# Patient Record
Sex: Female | Born: 1995 | Race: Black or African American | Hispanic: No | Marital: Single | Smoking: Never smoker
Health system: Southern US, Community
[De-identification: ages and names within clinical notes are randomized; demographics above are authoritative.]

---

## 2020-09-10 ENCOUNTER — Other Ambulatory Visit: Payer: Self-pay

## 2020-09-10 ENCOUNTER — Inpatient Hospital Stay (HOSPITAL_COMMUNITY): Payer: Medicaid Other

## 2020-09-10 ENCOUNTER — Inpatient Hospital Stay (HOSPITAL_COMMUNITY)
Admission: AD | Admit: 2020-09-10 | Discharge: 2020-09-10 | Disposition: A | Discharge status: Home / Self Care | Payer: Medicaid Other | Attending: Obstetrics & Gynecology | Admitting: Obstetrics & Gynecology

## 2020-09-10 ENCOUNTER — Encounter (HOSPITAL_COMMUNITY): Payer: Self-pay | Admitting: Obstetrics & Gynecology

## 2020-09-10 DIAGNOSIS — R109 Unspecified abdominal pain: Secondary | ICD-10-CM

## 2020-09-10 DIAGNOSIS — N949 Unspecified condition associated with female genital organs and menstrual cycle: Secondary | ICD-10-CM | POA: Diagnosis not present

## 2020-09-10 DIAGNOSIS — Z3A19 19 weeks gestation of pregnancy: Secondary | ICD-10-CM | POA: Insufficient documentation

## 2020-09-10 DIAGNOSIS — O26892 Other specified pregnancy related conditions, second trimester: Secondary | ICD-10-CM | POA: Diagnosis not present

## 2020-09-10 DIAGNOSIS — O99891 Other specified diseases and conditions complicating pregnancy: Secondary | ICD-10-CM | POA: Diagnosis not present

## 2020-09-10 DIAGNOSIS — R102 Pelvic and perineal pain: Secondary | ICD-10-CM | POA: Diagnosis not present

## 2020-09-10 LAB — CBC
HCT: 35.3 % — ABNORMAL LOW (ref 36.0–46.0)
Hemoglobin: 12.2 g/dL (ref 12.0–15.0)
MCH: 34.2 pg — ABNORMAL HIGH (ref 26.0–34.0)
MCHC: 34.6 g/dL (ref 30.0–36.0)
MCV: 98.9 fL (ref 80.0–100.0)
Platelets: 363 10*3/uL (ref 150–400)
RBC: 3.57 MIL/uL — ABNORMAL LOW (ref 3.87–5.11)
RDW: 12 % (ref 11.5–15.5)
WBC: 7.4 10*3/uL (ref 4.0–10.5)
nRBC: 0 % (ref 0.0–0.2)

## 2020-09-10 LAB — URINALYSIS, ROUTINE W REFLEX MICROSCOPIC
Bilirubin Urine: NEGATIVE
Glucose, UA: NEGATIVE mg/dL
Hgb urine dipstick: NEGATIVE
Ketones, ur: 5 mg/dL — AB
Leukocytes,Ua: NEGATIVE
Nitrite: NEGATIVE
Protein, ur: 30 mg/dL — AB
Specific Gravity, Urine: 1.028 (ref 1.005–1.030)
pH: 5 (ref 5.0–8.0)

## 2020-09-10 LAB — ABO/RH: ABO/RH(D): A POS

## 2020-09-10 LAB — HCG, QUANTITATIVE, PREGNANCY: hCG, Beta Chain, Quant, S: 9563 m[IU]/mL — ABNORMAL HIGH (ref <5)

## 2020-09-10 LAB — POCT PREGNANCY, URINE: Preg Test, Ur: POSITIVE — AB

## 2020-09-10 NOTE — MAU Provider Note (Signed)
Chief Complaint:  Abdominal Pain   None    HPI: Chelsea Ford is a 25 y.o. G1P0 at 339w3d who presents to maternity admissions reporting missed cycles since December (LMP=04/27/20), left lower pelvic pain that happens randomly and feels sharp, none now. Unsure of dates because cycles were irregular since start of OCPs. Had a +UPT at home a couple of weeks ago and at [facility]. This is not a desired pregnancy, unsure of options. Denies vaginal bleeding, leaking of fluid, fever, falls, or recent illness.   Pregnancy Course: No PNC  No past medical history on file. OB History  Gravida Para Term Preterm AB Living  1            SAB IAB Ectopic Multiple Live Births               # Outcome Date GA Lbr Len/2nd Weight Sex Delivery Anes PTL Lv  1 Current           Not on File No medications prior to admission.   I have reviewed patient's Past Medical Hx, Surgical Hx, Family Hx, Social Hx, medications and allergies.   ROS:  Review of Systems  Constitutional: Negative for fatigue and fever.  HENT: Negative for congestion and sore throat.   Gastrointestinal: Positive for nausea (had some several weeks ago, none now). Negative for abdominal pain.  Genitourinary: Positive for pelvic pain (left lower).  Musculoskeletal: Negative for back pain.  Neurological: Negative for dizziness, syncope and headaches.  All other systems reviewed and are negative.  Physical Exam   Patient Vitals for the past 24 hrs:  BP Temp Temp src Pulse Resp SpO2 Height Weight  09/10/20 1329 118/69 99 F (37.2 C) Oral 96 16 100 % -- --  09/10/20 1321 -- -- -- -- -- -- 5' 1.5" (1.562 m) 148 lb (67.1 kg)   Constitutional: Well-developed, well-nourished female in no acute distress.  Cardiovascular: normal rate & rhythm, no murmur Respiratory: normal effort, lung sounds clear throughout GI: Abd soft, non-tender, gravid appropriate for gestational age. Pos BS x 4 MS: Extremities nontender, no  edema, normal ROM Neurologic: Alert and oriented x 4.  GU: no CVA tenderness Pelvic exam deferred  Labs: Results for orders placed or performed during the hospital encounter of 09/10/20 (from the past 24 hour(s))  CBC     Status: Abnormal   Collection Time: 09/10/20 12:49 PM  Result Value Ref Range   WBC 7.4 4.0 - 10.5 K/uL   RBC 3.57 (L) 3.87 - 5.11 MIL/uL   Hemoglobin 12.2 12.0 - 15.0 g/dL   HCT 16.135.3 (L) 09.636.0 - 04.546.0 %   MCV 98.9 80.0 - 100.0 fL   MCH 34.2 (H) 26.0 - 34.0 pg   MCHC 34.6 30.0 - 36.0 g/dL   RDW 40.912.0 81.111.5 - 91.415.5 %   Platelets 363 150 - 400 K/uL   nRBC 0.0 0.0 - 0.2 %  ABO/Rh     Status: None   Collection Time: 09/10/20 12:49 PM  Result Value Ref Range   ABO/RH(D)      A POS Performed at [facility], [address] [place], [place] [address]   hCG, quantitative, pregnancy     Status: Abnormal   Collection Time: 09/10/20 12:49 PM  Result Value Ref Range   hCG, Beta Chain, Quant, S 9,563 (H) <5 mIU/mL  Pregnancy, urine POC     Status: Abnormal   Collection Time: 09/10/20  2:20 PM  Result Value Ref Range   Preg Test, Ur POSITIVE (A) NEGATIVE  Urinalysis, Routine w reflex microscopic Urine, Clean Catch     Status: Abnormal   Collection Time: 09/10/20  2:21 PM  Result Value Ref Range   Color, Urine AMBER (A) YELLOW   APPearance CLOUDY (A) CLEAR   Specific Gravity, Urine 1.028 1.005 - 1.030   pH 5.0 5.0 - 8.0   Glucose, UA NEGATIVE NEGATIVE mg/dL   Hgb urine dipstick NEGATIVE NEGATIVE   Bilirubin Urine NEGATIVE NEGATIVE   Ketones, ur 5 (A) NEGATIVE mg/dL   Protein, ur 30 (A) NEGATIVE mg/dL   Nitrite NEGATIVE NEGATIVE   Leukocytes,Ua NEGATIVE NEGATIVE   RBC / HPF 0-5 0 - 5 RBC/hpf   WBC, UA 0-5 0 - 5 WBC/hpf   Bacteria, UA RARE (A) NONE SEEN   Squamous Epithelial / LPF 21-50 0 - 5   Mucus PRESENT     Imaging:  [place] MFM OB LIMITED  Result Date: 09/10/2020 ----------------------------------------------------------------------  OBSTETRICS REPORT                        (Signed Final 09/10/2020 03:55 pm) ---------------------------------------------------------------------- Patient Info  ID #:       [id number]                          D.O.B.:  Oct 25, 1995 (24 yrs)  Name:       Chelsea Ford             Visit Date: 09/10/2020 02:53 pm ---------------------------------------------------------------------- Performed By  Attending:        Noralee Space MD        Ref. Address:     [facility]                                                             [facility]                                                             Healthcare  Performed By:     Sandi Mealy        Secondary Phy.:   [facility] MAU/Triage                    RDMS  Referred By:      Judye Bos              [facility]uba CNM                               [facility] ---------------------------------------------------------------------- Orders  #  Description                           Code        Ordered By  1  [place]  MFM OB LIMITED                     21308.65    Nasiya Pascual ----------------------------------------------------------------------  #  Order #                     Accession #                Episode #  1  [id number]                   [id number]                 [id number] ---------------------------------------------------------------------- Indications  [redacted] weeks gestation of pregnancy                Z3A.19  Abdominal pain in pregnancy                    O99.89 ---------------------------------------------------------------------- Fetal Evaluation  Num Of Fetuses:         1  Fetal Heart Rate(bpm):  147  Cardiac Activity:       Observed  Presentation:           Breech  Placenta:               Posterior  P. Cord Insertion:      Visualized  Amniotic Fluid  AFI FV:      Within normal limits                              Largest Pocket(cm)                              4.6 ---------------------------------------------------------------------- Biometry  LV:         4.8  mm ---------------------------------------------------------------------- Gestational Age  LMP:           19w 3d        Date:  04/27/20                 EDD:   02/01/21  Best:          19w 3d     Det. By:  LMP  (04/27/20)          EDD:   02/01/21 ---------------------------------------------------------------------- Anatomy  Ventricles:            Appears normal         Stomach:                Appears normal, left                                                                        sided  Thoracic:              Appears normal         Kidneys:                Appear normal  Diaphragm:             Appears normal         Bladder:  Appears normal ---------------------------------------------------------------------- Cervix Uterus Adnexa  Cervix  Length:           3.97  cm.  Normal appearance by transabdominal scan.  Uterus  No abnormality visualized.  Right Ovary  Within normal limits.  Left Ovary  Within normal limits.  Cul De Sac  No free fluid seen.  Adnexa  No abnormality visualized. No adnexal mass  visualized. No free fluid. ---------------------------------------------------------------------- Impression  Patient was evaluated for abdominal pain. History of vaginal  bleeding that had resolved.  A limited ultrasound study was performed .Amniotic fluid is  normal and good fetal activity is seen. On transabdominal  scan, the cervix looks long and closed .  Placenta appears normal. No evidence of previa. ----------------------------------------------------------------------                  Noralee Space, MD Electronically Signed Final Report   09/10/2020 03:55 pm ----------------------------------------------------------------------  MAU Course: Orders Placed This Encounter  Procedures  . [place] MFM OB LIMITED  . CBC  . hCG, quantitative, pregnancy  . Urinalysis, Routine w reflex microscopic Urine, Clean Catch  . Pregnancy, urine POC  . ABO/Rh  . Discharge patient   No orders of the  defined types were placed in this encounter.  MDM: Reviewed U/S findings with pt, she became tearful but able to engage in convo about options. Discussed [facility] locations that perform 2nd trimester abortions and advised her to make appt asap if that is her decision as the risk increases as her pregnancy advances. Pt expressed understanding. Also discussed adoption and gave contact info of adoption attorney. Advised pt can follow up at [facility] for routine OB care if she desires to keep pregnancy and reassured her that we have pregnancy navigators that can help with resource identification and connection. Pt expressed appreciation and relief.  Assessment: 1. [redacted] weeks gestation of pregnancy   2. Abdominal pain affecting pregnancy   3. Round ligament pain    Plan: Discharge home in stable condition with 2nd trimester precautions.  Follow up as desired.    Follow-up Information    [facility]. Call.   Why: asap to make an appointment Contact information: [address] [place], [place] [address]  [phone]       [facility] [place] Call.   Why: for adoption planning Contact information: [email or web address]  [phone]              Allergies as of 09/10/2020   Not on File     Medication List    You have not been prescribed any medications.    Edd Arbour, CNM, MSN, IBCLC Certified Nurse Midwife, [facility]

## 2020-09-10 NOTE — MAU Note (Signed)
Chelsea Ford is a 25 y.o. here in MAU reporting: intermittent sharp pains for the past couple of weeks. Previously had a small amount of bleeding but none today. Denies discharge. + UPT in January   LMP: 04/27/20  Onset of complaint: ongoing  Pain score: 9/10  Vitals:   09/10/20 1329  BP: 118/69  Pulse: 96  Resp: 16  Temp: 99 F (37.2 C)  SpO2: 100%     Lab orders placed from triage: UA, UPT

## 2020-09-10 NOTE — Discharge Instructions (Signed)

## 2021-06-19 ENCOUNTER — Emergency Department (HOSPITAL_BASED_OUTPATIENT_CLINIC_OR_DEPARTMENT_OTHER)
Admission: EM | Admit: 2021-06-19 | Discharge: 2021-06-19 | Disposition: A | Discharge status: Home / Self Care | Payer: Medicaid Other | Attending: Emergency Medicine | Admitting: Emergency Medicine

## 2021-06-19 ENCOUNTER — Emergency Department (HOSPITAL_BASED_OUTPATIENT_CLINIC_OR_DEPARTMENT_OTHER): Payer: Medicaid Other

## 2021-06-19 ENCOUNTER — Encounter (HOSPITAL_BASED_OUTPATIENT_CLINIC_OR_DEPARTMENT_OTHER): Payer: Self-pay | Admitting: Emergency Medicine

## 2021-06-19 ENCOUNTER — Other Ambulatory Visit: Payer: Self-pay

## 2021-06-19 DIAGNOSIS — Z3A01 Less than 8 weeks gestation of pregnancy: Secondary | ICD-10-CM | POA: Diagnosis not present

## 2021-06-19 DIAGNOSIS — N939 Abnormal uterine and vaginal bleeding, unspecified: Secondary | ICD-10-CM

## 2021-06-19 DIAGNOSIS — O26891 Other specified pregnancy related conditions, first trimester: Secondary | ICD-10-CM | POA: Diagnosis present

## 2021-06-19 DIAGNOSIS — O200 Threatened abortion: Secondary | ICD-10-CM | POA: Diagnosis not present

## 2021-06-19 DIAGNOSIS — N9489 Other specified conditions associated with female genital organs and menstrual cycle: Secondary | ICD-10-CM | POA: Diagnosis not present

## 2021-06-19 LAB — CBC
HCT: 39.7 % (ref 36.0–46.0)
Hemoglobin: 13.8 g/dL (ref 12.0–15.0)
MCH: 33.1 pg (ref 26.0–34.0)
MCHC: 34.8 g/dL (ref 30.0–36.0)
MCV: 95.2 fL (ref 80.0–100.0)
Platelets: 376 10*3/uL (ref 150–400)
RBC: 4.17 MIL/uL (ref 3.87–5.11)
RDW: 11.9 % (ref 11.5–15.5)
WBC: 9 10*3/uL (ref 4.0–10.5)
nRBC: 0 % (ref 0.0–0.2)

## 2021-06-19 LAB — COMPREHENSIVE METABOLIC PANEL
ALT: 8 U/L (ref 0–44)
AST: 15 U/L (ref 15–41)
Albumin: 4.4 g/dL (ref 3.5–5.0)
Alkaline Phosphatase: 29 U/L — ABNORMAL LOW (ref 38–126)
Anion gap: 11 (ref 5–15)
BUN: 9 mg/dL (ref 6–20)
CO2: 24 mmol/L (ref 22–32)
Calcium: 9.4 mg/dL (ref 8.9–10.3)
Chloride: 101 mmol/L (ref 98–111)
Creatinine, Ser: 0.61 mg/dL (ref 0.44–1.00)
GFR, Estimated: >60 mL/min (ref >60)
Glucose, Bld: 124 mg/dL — ABNORMAL HIGH (ref 70–99)
Potassium: 3.2 mmol/L — ABNORMAL LOW (ref 3.5–5.1)
Sodium: 136 mmol/L (ref 135–145)
Total Bilirubin: 0.4 mg/dL (ref 0.3–1.2)
Total Protein: 7.9 g/dL (ref 6.5–8.1)

## 2021-06-19 LAB — URINALYSIS, ROUTINE W REFLEX MICROSCOPIC: Specific Gravity, Urine: <1.005 — ABNORMAL LOW (ref 1.005–1.030)

## 2021-06-19 LAB — URINALYSIS, MICROSCOPIC (REFLEX): RBC / HPF: >50 RBC/hpf (ref 0–5)

## 2021-06-19 LAB — HCG, QUANTITATIVE, PREGNANCY: hCG, Beta Chain, Quant, S: 1220 m[IU]/mL — ABNORMAL HIGH (ref <5)

## 2021-06-19 LAB — PREGNANCY, URINE: Preg Test, Ur: POSITIVE — AB

## 2021-06-19 LAB — ABO/RH: ABO/RH(D): A POS

## 2021-06-19 LAB — LIPASE, BLOOD: Lipase: 13 U/L (ref 11–51)

## 2021-06-19 MED ORDER — SODIUM CHLORIDE 0.9 % IV BOLUS
1000.0000 mL | Freq: Once | INTRAVENOUS | Status: AC
Start: 2021-06-19 — End: 2021-06-19
  Administered 2021-06-19: 1000 mL via INTRAVENOUS

## 2021-06-19 NOTE — ED Provider Notes (Signed)
[facility] Provider Note   CSN: [id number] Arrival date & time: 06/19/21  1302     History  Chief Complaint  Patient presents with   Abdominal Pain   Vaginal Bleeding    Chelsea Ford is a 26 y.o. female.  HPI 26 year old female G2 P0 LMP 11 7 with positive pregnancy test.  Patient states that she had set up her first OB visit for later this week.  She has not had any prenatal care.  She began having some spotting in the past 24 hours and had increased cramping and bleeding.  She reports passing some tissue earlier and having decreased bleeding and pain since that time.  She thinks that she has miscarried.  Her first pregnancy ended in a elective abortion.  She does not know her blood type.    Home Medications Prior to Admission medications   Not on File      Allergies    Patient has no known allergies.    Review of Systems   Review of Systems  All other systems reviewed and are negative.  Physical Exam Updated Vital Signs BP 116/80    Pulse 88    Temp 98.6 F (37 C)    Resp 18    Ht 1.575 m (5\' 2" )    Wt 63.5 kg    LMP 03/18/2021 (Approximate)    SpO2 100%    BMI 25.61 kg/m  Physical Exam Vitals and nursing note reviewed.  Constitutional:      General: She is not in acute distress.    Appearance: She is well-developed.  HENT:     Mouth/Throat:     Mouth: Mucous membranes are moist.  Eyes:     Extraocular Movements: Extraocular movements intact.  Cardiovascular:     Rate and Rhythm: Normal rate and regular rhythm.     Heart sounds: Normal heart sounds.  Pulmonary:     Effort: Pulmonary effort is normal.  Abdominal:     General: Abdomen is flat. Bowel sounds are normal.     Palpations: Abdomen is soft.  Genitourinary:    Vagina: Bleeding present.     Cervix: Normal.     Uterus: Enlarged.      Adnexa: Right adnexa normal and left adnexa normal.  Skin:    General: Skin is warm and dry.     Capillary Refill: Capillary  refill takes less than 2 seconds.  Neurological:     Mental Status: She is alert.    ED Results / Procedures / Treatments   Labs (all labs ordered are listed, but only abnormal results are displayed) Labs Reviewed  PREGNANCY, URINE - Abnormal; Notable for the following components:      Result Value   Preg Test, Ur POSITIVE (*)    All other components within normal limits  COMPREHENSIVE METABOLIC PANEL - Abnormal; Notable for the following components:   Potassium 3.2 (*)    Glucose, Bld 124 (*)    Alkaline Phosphatase 29 (*)    All other components within normal limits  URINALYSIS, ROUTINE W REFLEX MICROSCOPIC - Abnormal; Notable for the following components:   Color, Urine RED (*)    APPearance TURBID (*)    Specific Gravity, Urine <1.005 (*)    Glucose, UA   (*)    Value: TEST NOT REPORTED DUE TO COLOR INTERFERENCE OF URINE PIGMENT   Hgb urine dipstick   (*)    Value: TEST NOT REPORTED DUE TO COLOR INTERFERENCE OF URINE  PIGMENT   Bilirubin Urine   (*)    Value: TEST NOT REPORTED DUE TO COLOR INTERFERENCE OF URINE PIGMENT   Ketones, ur   (*)    Value: TEST NOT REPORTED DUE TO COLOR INTERFERENCE OF URINE PIGMENT   Protein, ur   (*)    Value: TEST NOT REPORTED DUE TO COLOR INTERFERENCE OF URINE PIGMENT   Nitrite   (*)    Value: TEST NOT REPORTED DUE TO COLOR INTERFERENCE OF URINE PIGMENT   Leukocytes,Ua   (*)    Value: TEST NOT REPORTED DUE TO COLOR INTERFERENCE OF URINE PIGMENT   All other components within normal limits  URINALYSIS, MICROSCOPIC (REFLEX) - Abnormal; Notable for the following components:   Bacteria, UA MANY (*)    All other components within normal limits  HCG, QUANTITATIVE, PREGNANCY - Abnormal; Notable for the following components:   hCG, Beta Chain, Quant, S 1,220 (*)    All other components within normal limits  LIPASE, BLOOD  CBC  TYPE AND SCREEN  ABO/RH    EKG None  Radiology US OB LESS THAN 14 WEEKS WITH OB TRANSVAGINAL  Result Date:  06/19/2021 CLINICAL DATA:  Severe pelvic cramps and heavy vaginal bleeding. EXAM: OBSTETRIC <14 WK [place] AND TRANSVAGINAL OB US TECHNIQUE: Both transabdominal and transvaginal ultrasound examinations were performed for complete evaluation of the gestation as well as the maternal uterus, adnexal regions, and pelvic cul-de-sac. Transvaginal technique was performed to assess early pregnancy. COMPARISON:  None. FINDINGS: Intrauterine gestational sac: None Yolk sac:  Not Visualized. Embryo:  Not Visualized. Cardiac Activity: Not Visualized. Maternal uterus/adnexae: Right ovary: Normal Left ovary: Normal Other :The endometrium appears heterogeneous with areas of fluid noted in the lower uterine segment. The endometrial thickness measures up to 1.2 cm. Free fluid:  No free fluid identified IMPRESSION: 1. No intrauterine gestational sac, yolk sac, or fetal pole identified. Differential considerations include intrauterine pregnancy too early to be sonographically visualized, missed abortion, or ectopic pregnancy. Followup ultrasound is recommended in 10-14 days for further evaluation. 2. Thickened and heterogeneous appearance of the endometrium measuring up to 1.2 cm. Note: Retained products of conception can be suspected on ultrasound if the endometrial thickness is >10 mm following spontaneous abortion. Electronically Signed   By: Signa Kell M.D.   On: 06/19/2021 17:32    Procedures Procedures    Medications Ordered in ED Medications  sodium chloride 0.9 % bolus 1,000 mL (1,000 mLs Intravenous New Bag/Given 06/19/21 1552)    ED Course/ Medical Decision Making/ A&P                           Medical Decision Making  26 year old female presents today with reports of positive pregnancy test at home.  She has had cramping and bleeding today.  By her ports she had last menstrual period in November and has had a positive pregnancy test at home.  She passed some tissue and feels improved since that time.  Labs here  are stable.  Ultrasound obtained shows no intrauterine pregnancy.  Given dates, feel this is most consistent with spontaneous abortion.  Quantitative pregnancy here is 1220. Discussed care with Dr. Donavan Foil, on-call for OB/GYN. Discussed return precautions and need for close follow-up with the patient.  These include increased bleeding, increased pain and she will need to be seen and rechecked in the next 2 to 3 days.  Have a low index of suspicion for ectopic pregnancy given the timeframe and  her quantitative beta-hCG.  However, this will need to be trended down and ensure that her symptoms resolved.  Discussed the above with the patient.  She is in agreement with the plan.  {Final Clinical Impression(s) / ED Diagnoses Final diagnoses:  Threatened abortion    Rx / DC Orders ED Discharge Orders     None         Margarita Grizzle, MD 06/19/21 2148

## 2021-06-19 NOTE — Discharge Instructions (Addendum)
Your work up is consistent with a miscarriage. However, if your symptoms worsen, especially increased bleeding, pain, or passing out, you should be reseen immediately. Please call and make an appointment at the [facility] that is listed on your discharge.  You will need to be received and rechecked in 2 to 4 days.

## 2021-06-19 NOTE — ED Triage Notes (Signed)
Pt c/o severe abdominal pain onset this morning with vaginal bleeding onset couple days as spotting to heavy bleeding couple hours ago. Pt is about [redacted] weeks pregnant, has not had any prenatal care.

## 2021-07-17 IMAGING — US US MFM OB LIMITED
1 series · 15 of 22 positions shown · non-contrast
Comparison: none

[Series 1: us mfm ob limited · 15 of 22 slices shown]
[im 1/22]
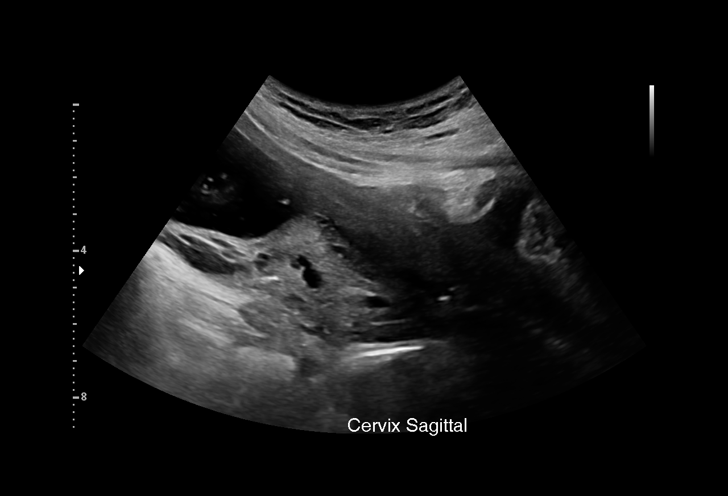
[im 3/22]
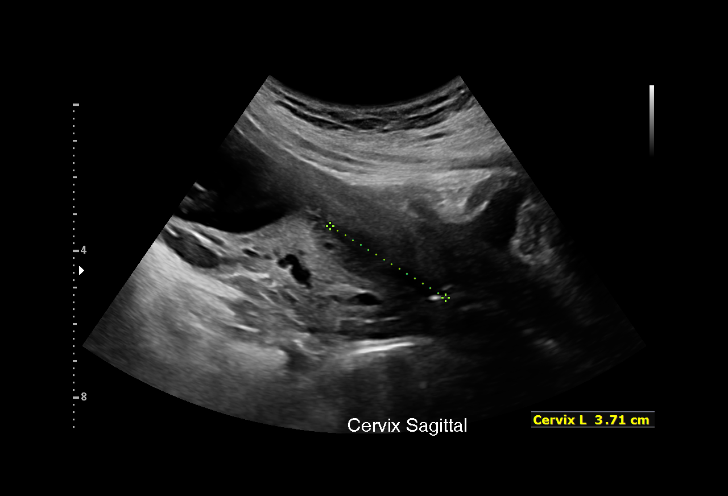
[im 4/22]
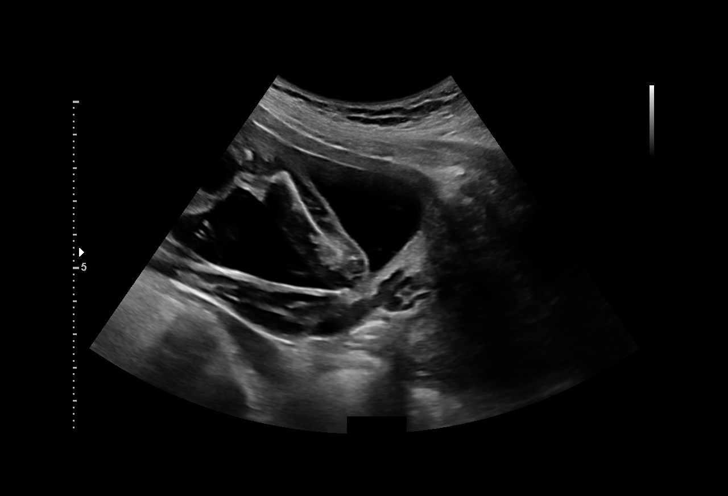
[im 6/22]
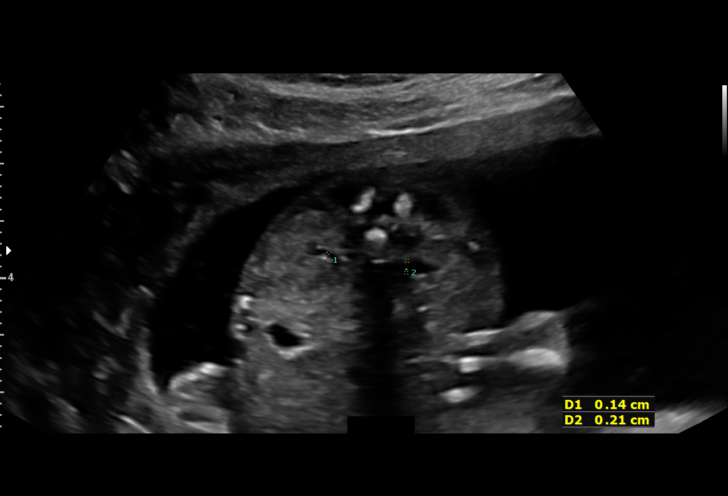
[im 7/22]
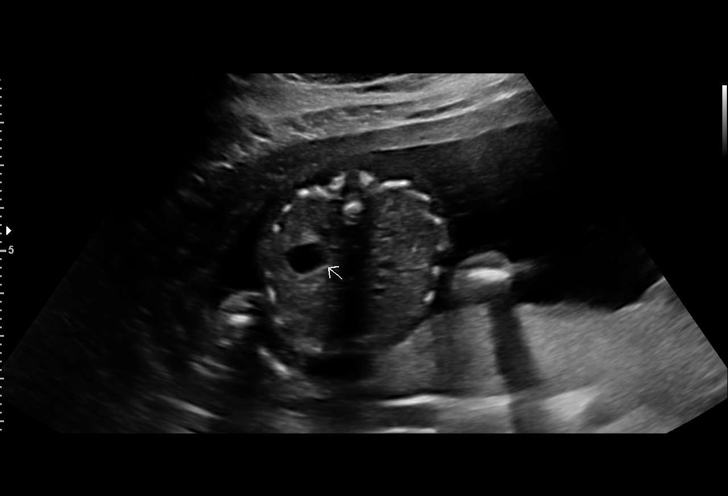
[im 9/22]
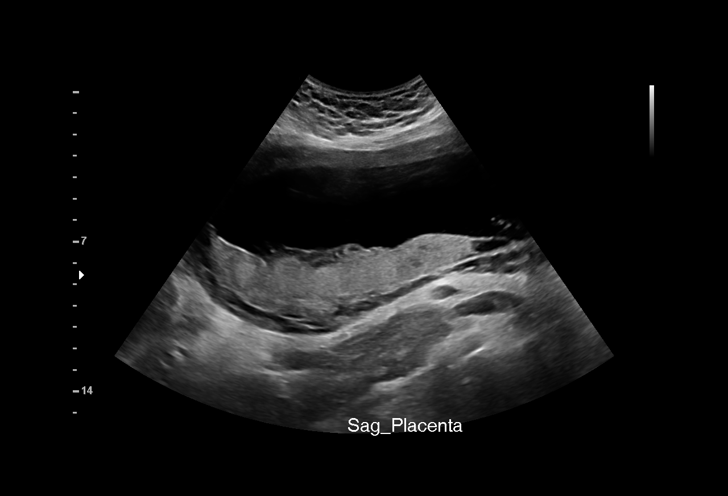
[im 10/22]
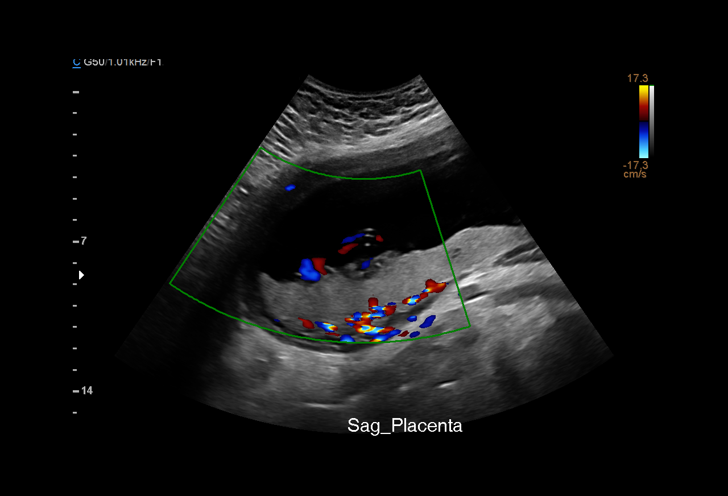
[im 12/22]
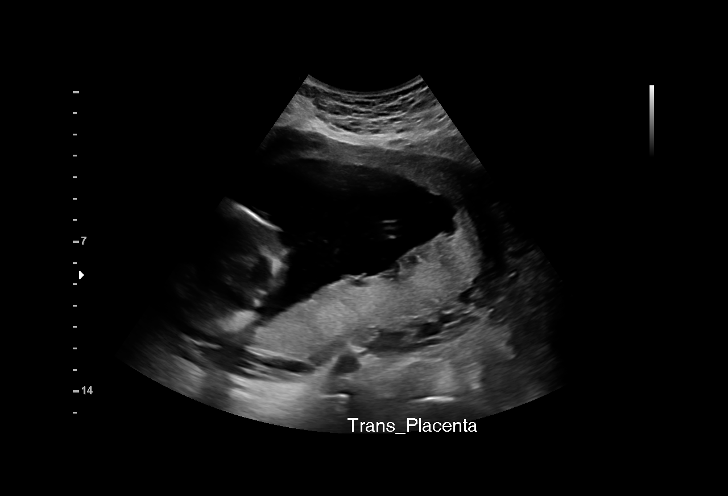
[im 13/22]
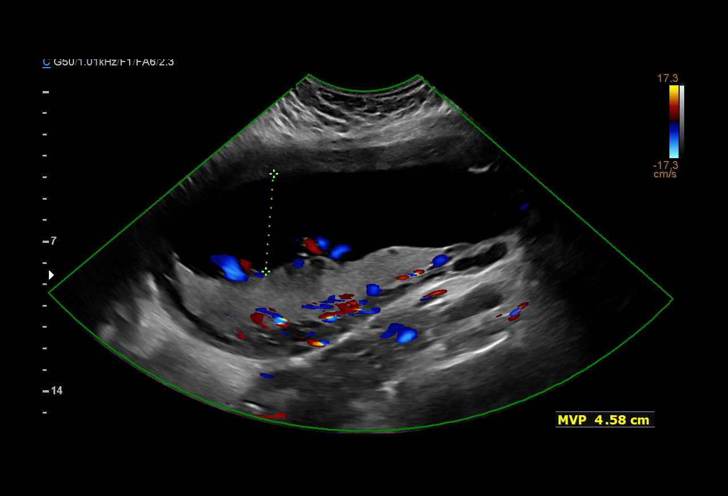
[im 14/22]
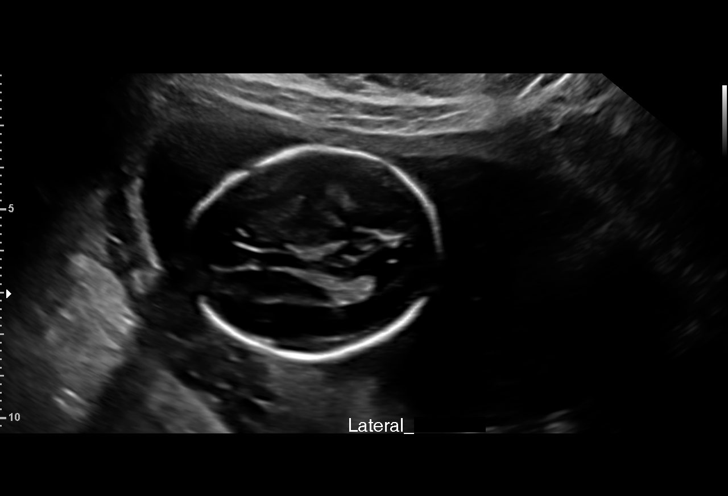
[im 16/22]
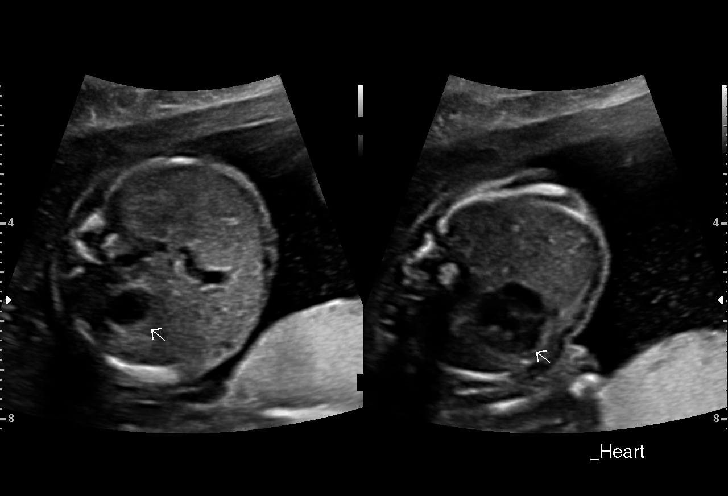
[im 17/22]
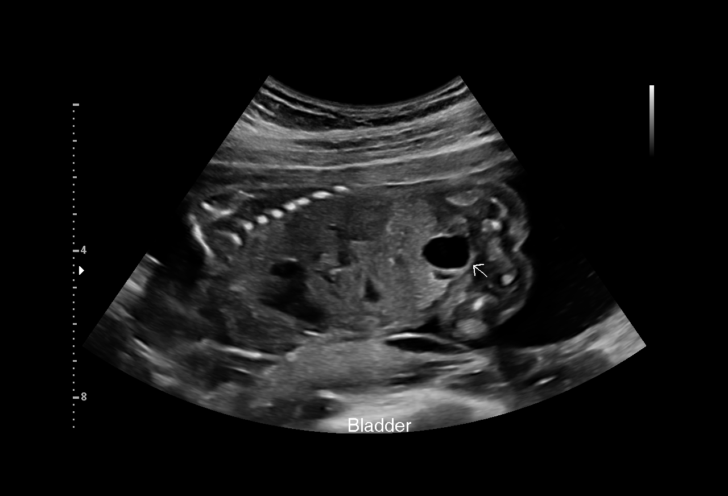
[im 19/22]
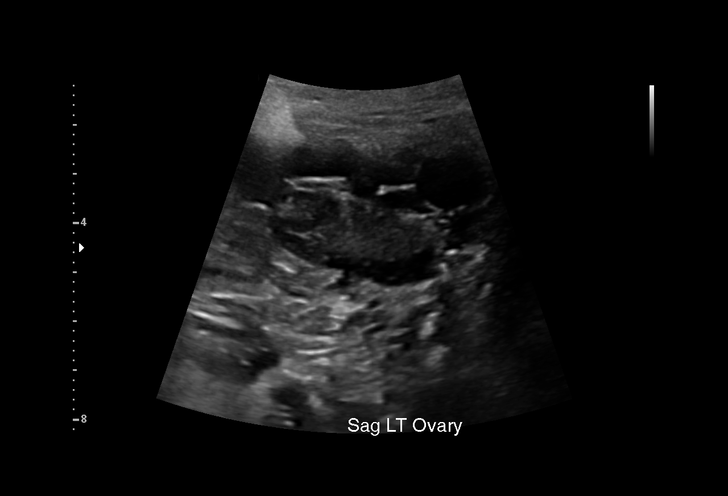
[im 20/22]
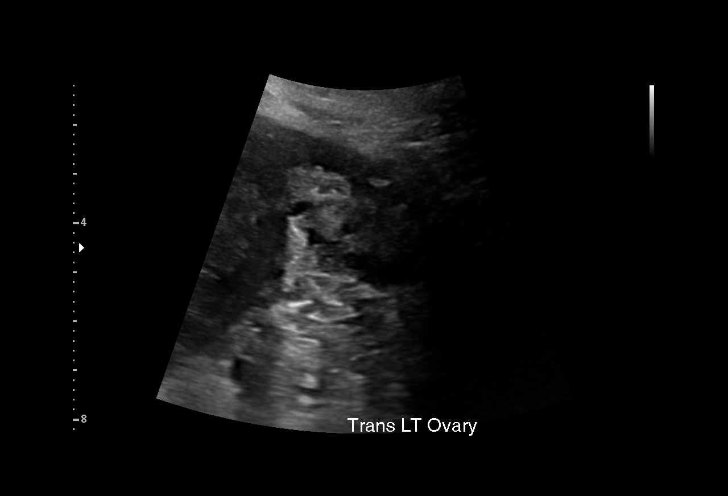
[im 22/22]
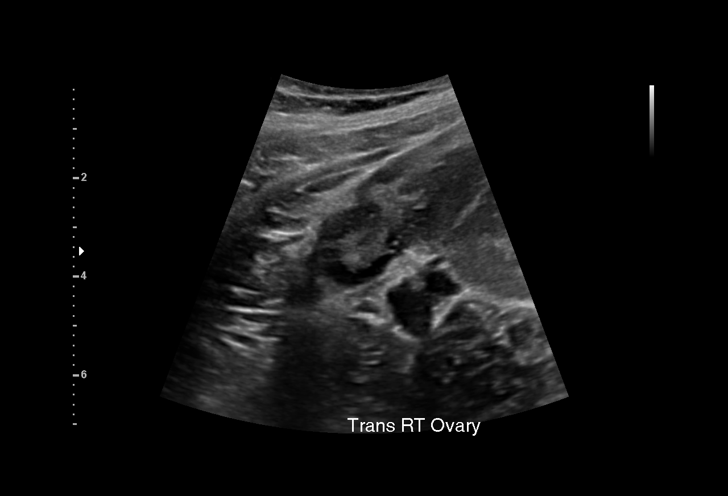

[15 of 22 positions shown; findings below may reference images not displayed]

[REDACTED]care
                   [facility] CNM                               [HOSPITAL]

 1  US MFM OB LIMITED                     [address].01    ABHENARH PRIEST

Indications

 19 weeks gestation of pregnancy
 Abdominal pain in pregnancy
Fetal Evaluation

 Num Of Fetuses:         1
 Fetal Heart Rate(bpm):  147
 Cardiac Activity:       Observed
 Presentation:           Breech
 Placenta:               Posterior
 P. Cord Insertion:      Visualized

 Amniotic Fluid
 AFI FV:      Within normal limits

                             Largest Pocket(cm)

Biometry

 LV:        4.8  mm
Gestational Age

 LMP:           19w 3d        Date:  04/27/20                 EDD:   02/01/21
 Best:          19w 3d     Det. By:  LMP  (04/27/20)          EDD:   02/01/21
Anatomy

 Ventricles:            Appears normal         Stomach:                Appears normal, left
                                                                       sided
 Thoracic:              Appears normal         Kidneys:                Appear normal
 Diaphragm:             Appears normal         Bladder:                Appears normal
Cervix Uterus Adnexa

 Cervix
 Length:           3.97  cm.
 Normal appearance by transabdominal scan.

 Uterus
 No abnormality visualized.

 Right Ovary
 Within normal limits.

 Left Ovary
 Within normal limits.

 Cul De Sac
 No free fluid seen.

 Adnexa
 No abnormality visualized. No adnexal mass
 visualized. No free fluid.
Impression

 Patient was evaluated for abdominal pain. History of vaginal
 bleeding that had resolved.
 A limited ultrasound study was performed .Amniotic fluid is
 normal and good fetal activity is seen. On transabdominal
 scan, the cervix looks long and closed .
 Placenta appears normal. No evidence of previa.
                 Era, Isaac Jacob

## 2022-04-25 IMAGING — US US OB < 14 WEEKS - US OB TV
1 series · 13 of 28 positions shown · non-contrast
Comparison: None.

CLINICAL DATA: Severe pelvic cramps and heavy vaginal bleeding.

EXAM:
OBSTETRIC <14 WK US AND TRANSVAGINAL OB US
TECHNIQUE: Both transabdominal and transvaginal ultrasound examinations were
performed for complete evaluation of the gestation as well as the
maternal uterus, adnexal regions, and pelvic cul-de-sac.
Transvaginal technique was performed to assess early pregnancy.

[Series 1: us ob less than 14 weeks with ob transvaginal · 13 of 73 slices shown]
[im 3/73]
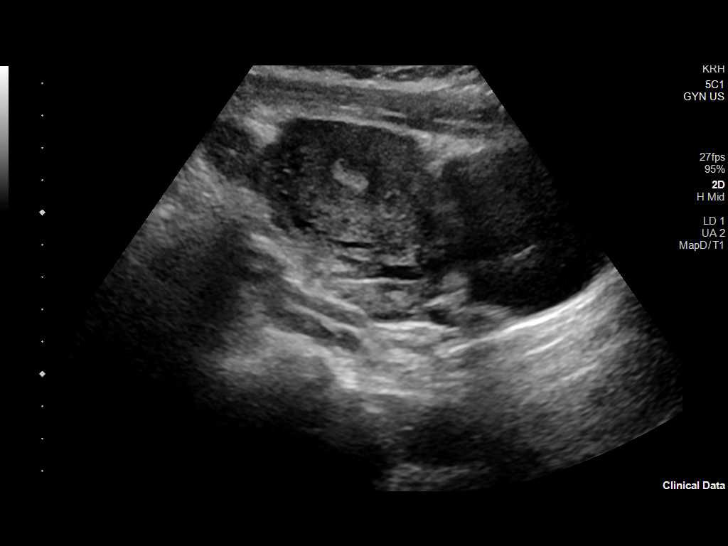
[im 9/73]
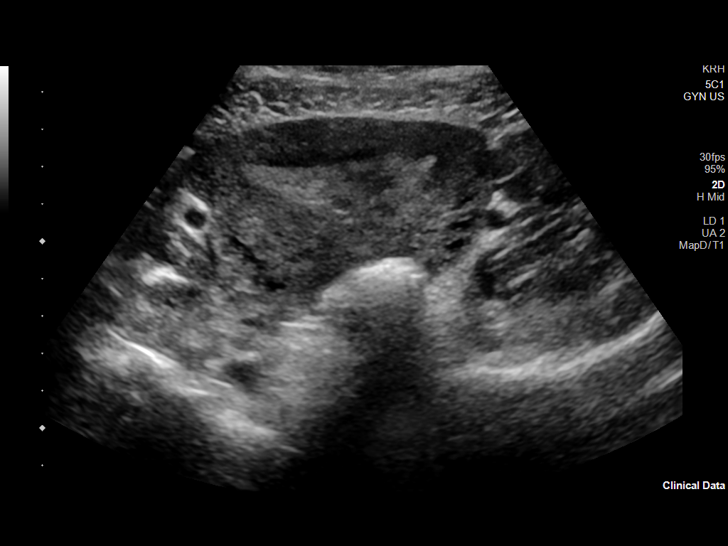
[im 14/73]
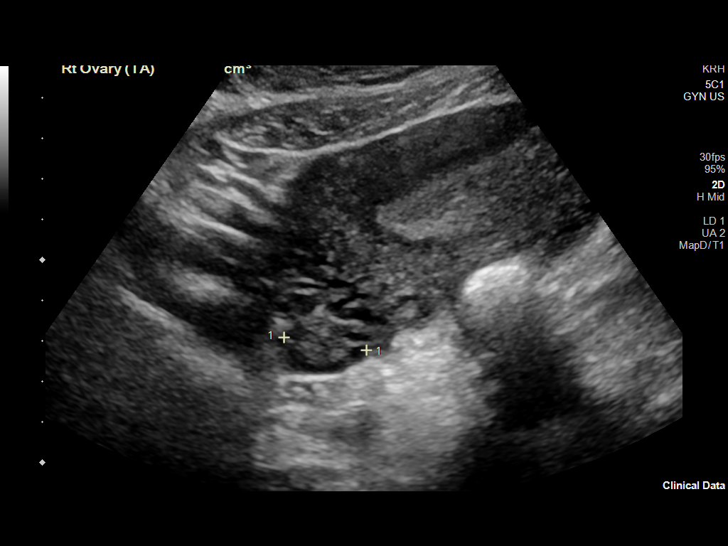
[im 19/73]
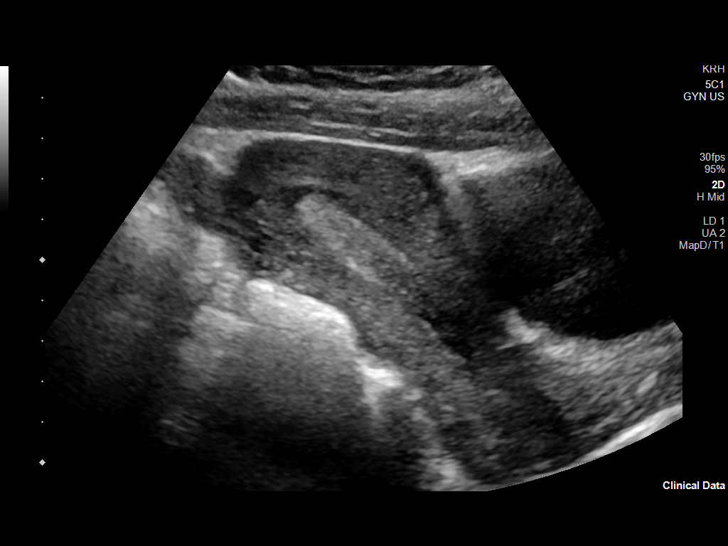
[im 25/73]
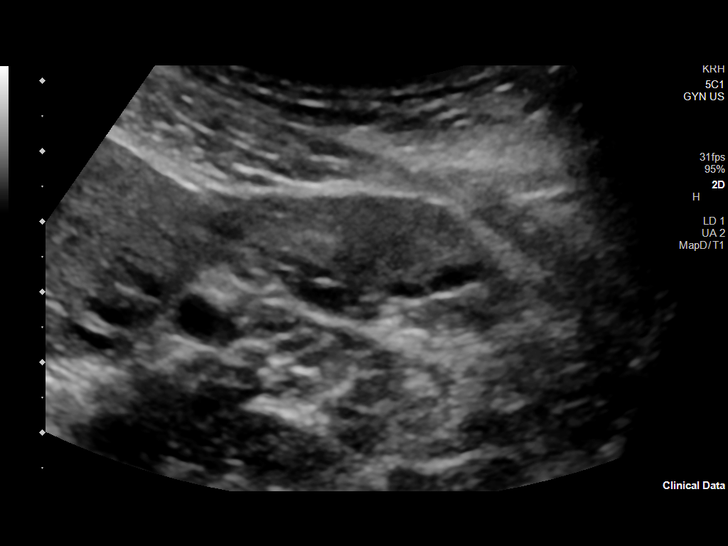
[im 30/73]
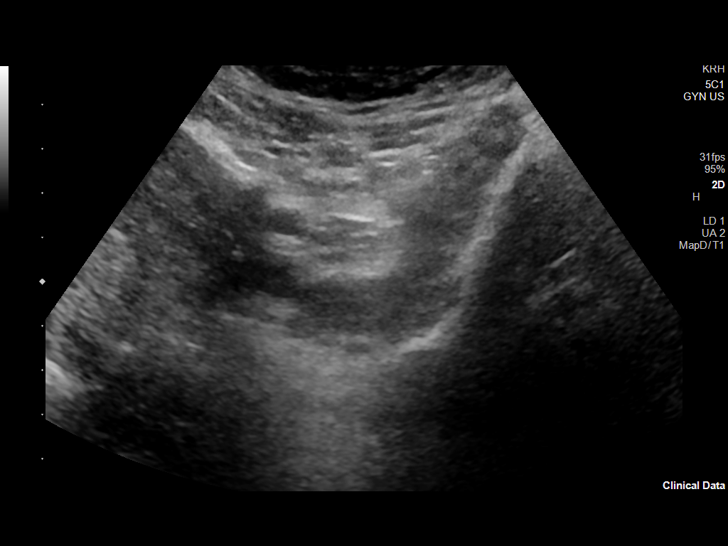
[im 38/73]
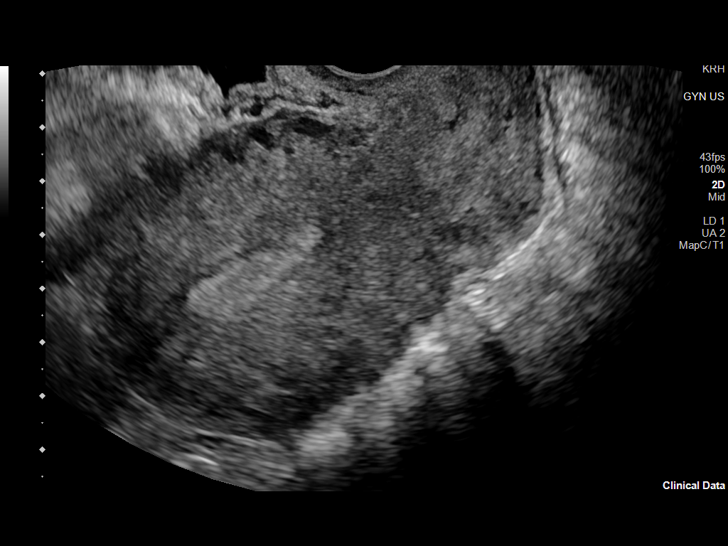
[im 43/73]
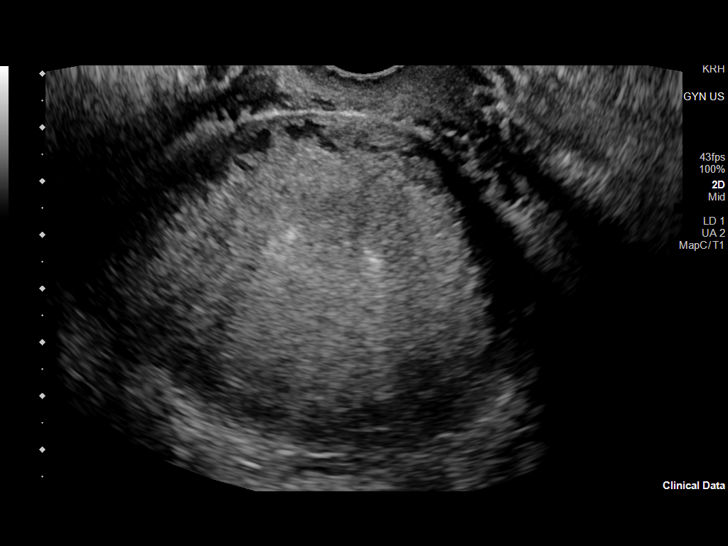
[im 49/73]
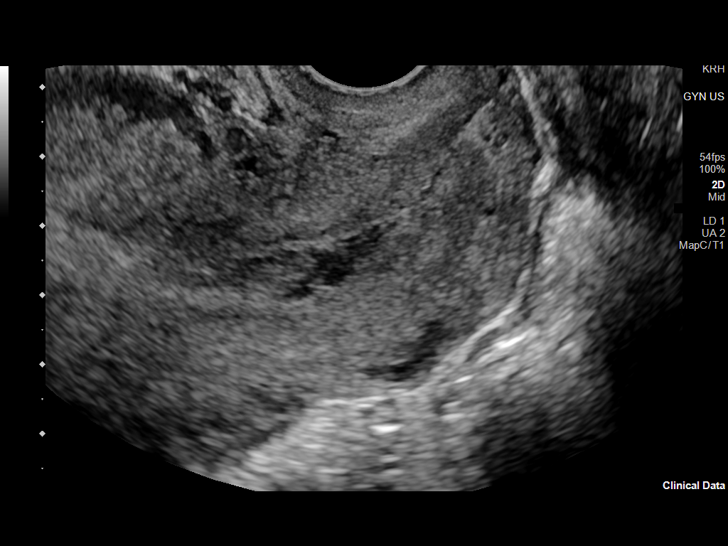
[im 54/73]
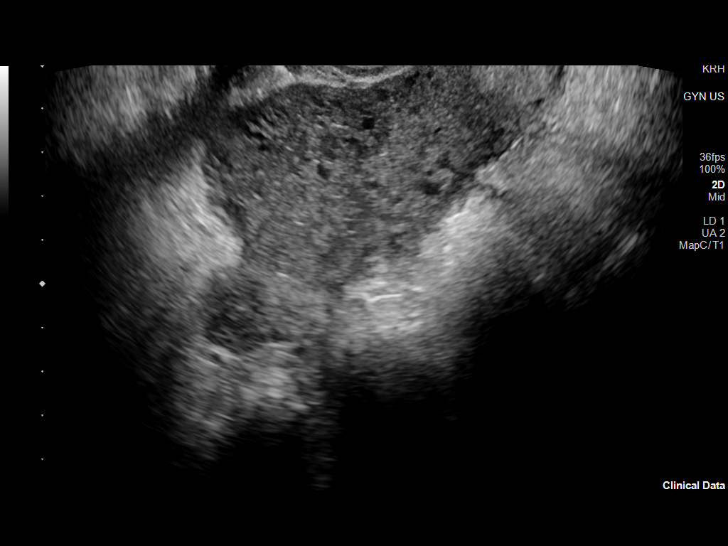
[im 59/73]
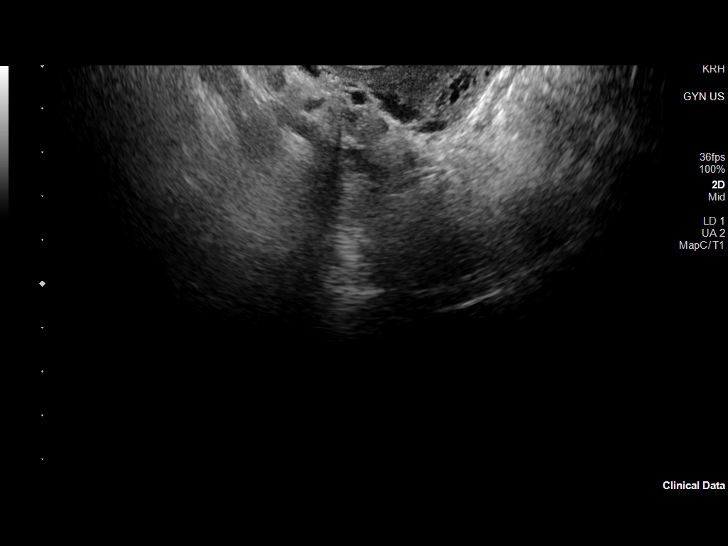
[im 65/73]
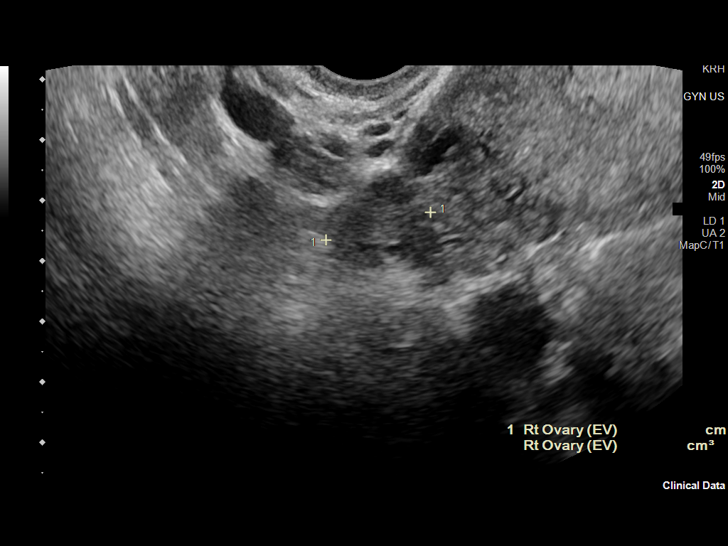
[im 70/73]
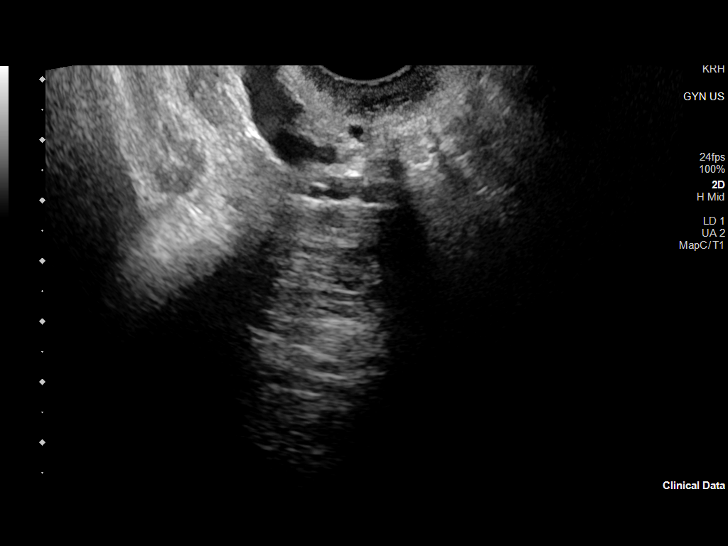

[13 of 28 positions shown; findings below may reference images not displayed]

FINDINGS: Intrauterine gestational sac: None

Yolk sac:  Not Visualized.

Embryo:  Not Visualized.

Cardiac Activity: Not Visualized.

Maternal uterus/adnexae:

Right ovary: Normal

Left ovary: Normal

Other :The endometrium appears heterogeneous with areas of fluid
noted in the lower uterine segment. The endometrial thickness
measures up to 1.2 cm.

Free fluid:  No free fluid identified
IMPRESSION: 1. No intrauterine gestational sac, yolk sac, or fetal pole
identified. Differential considerations include intrauterine
pregnancy too early to be sonographically visualized, missed
abortion, or ectopic pregnancy. Followup ultrasound is recommended
in 10-14 days for further evaluation.
2. Thickened and heterogeneous appearance of the endometrium
measuring up to 1.2 cm. Note: Retained products of conception can be
suspected on ultrasound if the endometrial thickness is >10 mm
following spontaneous abortion.

## 2023-07-25 ENCOUNTER — Emergency Department (HOSPITAL_BASED_OUTPATIENT_CLINIC_OR_DEPARTMENT_OTHER)
Admission: EM | Admit: 2023-07-25 | Discharge: 2023-07-25 | Disposition: A | Discharge status: Home / Self Care | Payer: Self-pay | Attending: Emergency Medicine | Admitting: Emergency Medicine

## 2023-07-25 ENCOUNTER — Other Ambulatory Visit: Payer: Self-pay

## 2023-07-25 ENCOUNTER — Emergency Department (HOSPITAL_BASED_OUTPATIENT_CLINIC_OR_DEPARTMENT_OTHER): Payer: Self-pay | Admitting: Radiology

## 2023-07-25 ENCOUNTER — Encounter (HOSPITAL_BASED_OUTPATIENT_CLINIC_OR_DEPARTMENT_OTHER): Payer: Self-pay

## 2023-07-25 DIAGNOSIS — W109XXA Fall (on) (from) unspecified stairs and steps, initial encounter: Secondary | ICD-10-CM | POA: Insufficient documentation

## 2023-07-25 DIAGNOSIS — M533 Sacrococcygeal disorders, not elsewhere classified: Secondary | ICD-10-CM | POA: Insufficient documentation

## 2023-07-25 MED ORDER — KETOROLAC TROMETHAMINE 15 MG/ML IJ SOLN
15.0000 mg | Freq: Once | INTRAMUSCULAR | Status: AC
  Administered 2023-07-25: 15 mg via INTRAMUSCULAR
  Filled 2023-07-25: qty 1

## 2023-07-25 NOTE — ED Provider Notes (Signed)
[place] EMERGENCY DEPARTMENT AT [facility] Provider Note   CSN: [id number] Arrival date & time: 07/25/23  1811     History  Chief Complaint  Patient presents with   Fall    Chelsea Ford is a 28 y.o. female who presents to ED concerned for coccyx pain after slipping and falling down 4 steps earlier today. No other symptoms.    Fall       Home Medications Prior to Admission medications   Not on File      Allergies    Patient has no known allergies.    Review of Systems   Review of Systems  Musculoskeletal:        Coccyx pain    Physical Exam Updated Vital Signs BP 121/85   Pulse 86   Temp (!) 97.2 F (36.2 C) (Temporal)   Resp 18   Ht 5\' 1"  (1.549 m)   Wt 72.6 kg   LMP 07/25/2023   SpO2 100%   BMI 30.23 kg/m  Physical Exam Vitals and nursing note reviewed.  Constitutional:      General: She is not in acute distress.    Appearance: She is not ill-appearing or toxic-appearing.  HENT:     Head: Normocephalic and atraumatic.  Eyes:     General: No scleral icterus.       Right eye: No discharge.        Left eye: No discharge.     Conjunctiva/sclera: Conjunctivae normal.  Cardiovascular:     Rate and Rhythm: Normal rate.  Pulmonary:     Effort: Pulmonary effort is normal.  Abdominal:     General: Abdomen is flat.  Musculoskeletal:     Comments: Tenderness to palpation in coccyx area.  Skin:    General: Skin is warm and dry.  Neurological:     General: No focal deficit present.     Mental Status: She is alert and oriented to person, place, and time. Mental status is at baseline.  Psychiatric:        Mood and Affect: Mood normal.        Behavior: Behavior normal.     ED Results / Procedures / Treatments   Labs (all labs ordered are listed, but only abnormal results are displayed) Labs Reviewed - No data to display  EKG None  Radiology DG Sacrum/Coccyx Result Date: 07/25/2023 CLINICAL DATA:  Sacrococcygeal pain  after falling down metal stairs this morning. EXAM: SACRUM AND COCCYX - 2+ VIEW COMPARISON:  None Available. FINDINGS: Mild posterior subluxation of the 2nd coccygeal segment. No fractures seen. IMPRESSION: Mild posterior subluxation of the 2nd coccygeal segment without fracture. This can be seen normally. A fall on this area with be expected to cause anterior subluxation, not posterior subluxation. Electronically Signed   By: Beckie Salts M.D.   On: 07/25/2023 20:31    Procedures Procedures    Medications Ordered in ED Medications  ketorolac (TORADOL) 15 MG/ML injection 15 mg (15 mg Intramuscular Given 07/25/23 2102)    ED Course/ Medical Decision Making/ A&P                                 Medical Decision Making Amount and/or Complexity of Data Reviewed Radiology: ordered.  Risk Prescription drug management.   This patient presents to the ED after a fall, this involves an extensive number of treatment options, and is a complaint that carries with it  a high risk of complications and morbidity.  The differential diagnosis includes  intracranial hemorrhage, subdural/epidural hematoma, vertebral fracture, spinal cord injury, muscle strain, skull fracture, fracture.   Co morbidities that complicate the patient evaluation  none   Additional history obtained:  It does not appear that patient follows with PCP. Will refer to community clinic.   Problem List / ED Course / Critical interventions / Medication management  Patient presented for slip and fall - concerned for coccyx pain. Patient with stable vitals and does not appear to be in distress. Physical exam with tenderness to palpation of coccyx area. Rest of physical exam unremarkable. Patient afebrile with stable vitals.  I ordered imaging studies including coccyx xray . I independently visualized and interpreted imaging which showed no acute process. I agree with the radiologist interpretation Shared results with patient and  answered all questions. Attempted to provide patient donut pillow for symptom control, but we do not have any of these in stock. Offered patient toradol shot for pain which she accepted.  I have reviewed the patients home medicines and have made adjustments as needed Patient was given return precautions. Patient stable for discharge at this time. Patient verbalized understanding of plan.   Social Determinants of Health:  none           Final Clinical Impression(s) / ED Diagnoses Final diagnoses:  Coccyx pain    Rx / DC Orders ED Discharge Orders     None         Margarita Rana 07/25/23 2107    Anders Simmonds T, DO 07/25/23 2328

## 2023-07-25 NOTE — ED Provider Triage Note (Signed)
Emergency Medicine Provider Triage Evaluation Note  Evaleigh Chyrel Taha , a 28 y.o. female  was evaluated in triage.  Pt complains of tailbone pain.  Review of Systems  Positive:  Negative:   Physical Exam  Ht 5\' 1"  (1.549 m)   Wt 72.6 kg   LMP 07/25/2023   BMI 30.23 kg/m  Gen:   Awake, no distress   Resp:  Normal effort  MSK:   Moves extremities without difficulty  Other:    Medical Decision Making  Medically screening exam initiated at 7:16 PM.  Appropriate orders placed.  Clea Claudetta Sallie was informed that the remainder of the evaluation will be completed by another provider, this initial triage assessment does not replace that evaluation, and the importance of remaining in the ED until their evaluation is complete.  Fell and slide down 4 steps today. With tailbone pain. No infectious symptoms. Patient currently on menstrual cycle.   Dorthy Cooler, [place] 07/25/23 1610

## 2023-07-25 NOTE — Discharge Instructions (Addendum)
It was a pleasure caring for you today. Please follow up with you primary care provider in the next 7 days if symptoms are not resolving appropriately. Seek emergency care if experiencing any new or worsening symptoms.   Alternating between 650 mg Tylenol and 400 mg Advil: The best way to alternate taking Acetaminophen (example Tylenol) and Ibuprofen (example Advil/Motrin) is to take them 3 hours apart. For example, if you take ibuprofen at 6 am you can then take Tylenol at 9 am. You can continue this regimen throughout the day, making sure you do not exceed the recommended maximum dose for each drug.

## 2023-07-25 NOTE — ED Triage Notes (Signed)
Pt POV d/t fall down 3-4 metal stairs this morning.  Pt c/o of tailbone pain - worse this evening.
